# Patient Record
Sex: Female | Born: 1935 | Race: White | Hispanic: No | Marital: Married
Health system: Southern US, Community
[De-identification: ages and names within clinical notes are randomized; demographics above are authoritative.]

---

## 2004-07-17 ENCOUNTER — Inpatient Hospital Stay: Payer: Self-pay | Admitting: Internal Medicine

## 2004-10-09 ENCOUNTER — Ambulatory Visit: Payer: Self-pay | Admitting: Family Medicine

## 2005-04-09 ENCOUNTER — Other Ambulatory Visit: Payer: Self-pay

## 2005-04-10 ENCOUNTER — Inpatient Hospital Stay: Payer: Self-pay | Admitting: Internal Medicine

## 2005-06-29 ENCOUNTER — Emergency Department: Payer: Self-pay | Admitting: Emergency Medicine

## 2006-10-06 ENCOUNTER — Other Ambulatory Visit: Payer: Self-pay

## 2006-10-06 ENCOUNTER — Inpatient Hospital Stay: Payer: Self-pay | Admitting: Internal Medicine

## 2006-11-08 ENCOUNTER — Other Ambulatory Visit: Payer: Self-pay

## 2006-11-08 ENCOUNTER — Emergency Department: Payer: Self-pay | Admitting: Internal Medicine

## 2006-11-23 ENCOUNTER — Other Ambulatory Visit: Payer: Self-pay

## 2006-11-23 ENCOUNTER — Emergency Department: Payer: Self-pay | Admitting: Emergency Medicine

## 2006-12-01 ENCOUNTER — Inpatient Hospital Stay: Payer: Self-pay | Admitting: Rheumatology

## 2006-12-25 ENCOUNTER — Inpatient Hospital Stay: Payer: Self-pay | Admitting: Specialist

## 2007-01-25 ENCOUNTER — Inpatient Hospital Stay: Payer: Self-pay | Admitting: Specialist

## 2007-11-09 IMAGING — CR DG ABDOMEN 1V
1 series · 1 of 1 positions shown · non-contrast
Comparison: none

REASON FOR EXAM: diarrhea
COMMENTS:

PROCEDURE:     DXR - DXR KIDNEY URETER BLADDER  - November 08, 2006 [DATE]
RESULT:     The bowel gas pattern is relatively nonspecific. The bones are
osteopenic. No abnormal calcifications are seen. Mild narrowing of the left
hip joint is noted.

[view not recorded]
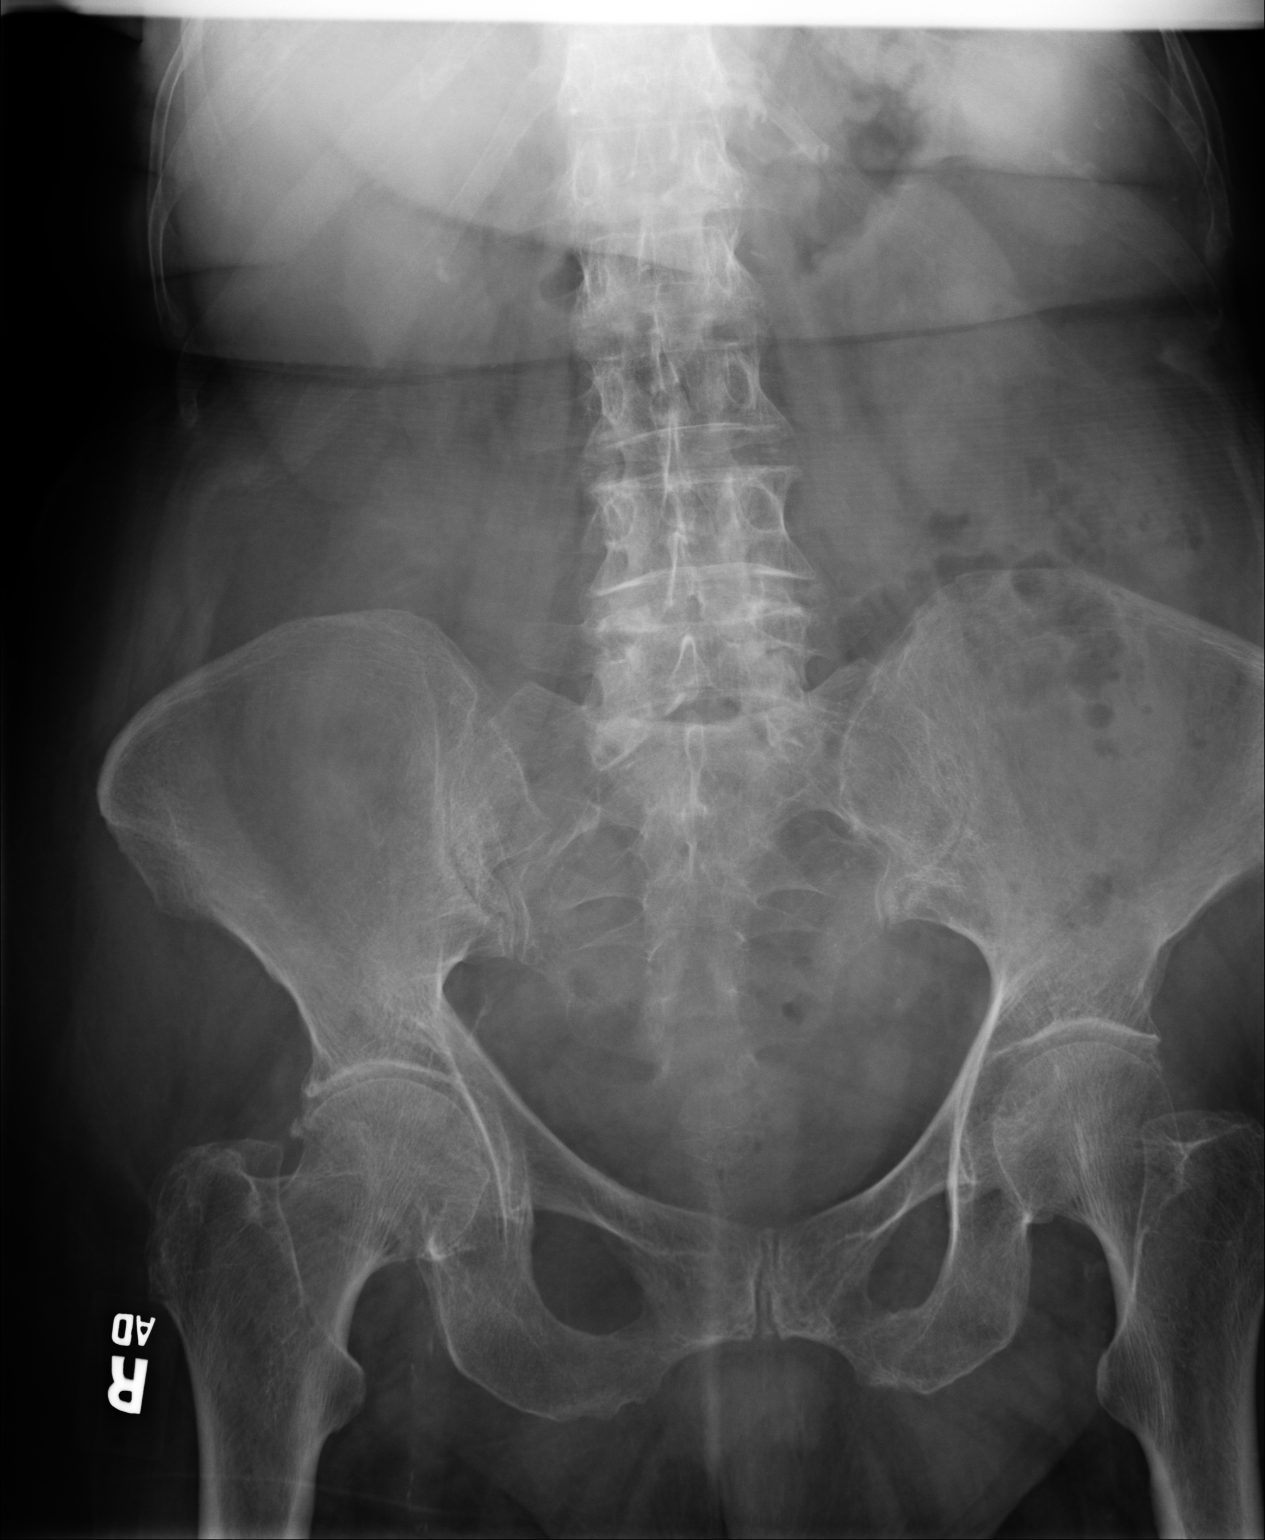

[1 of 1 positions shown; findings below may reference images not displayed]

IMPRESSION: 1. I do not see evidence of bowel obstruction or ileus.
2. There is diffuse osteopenia of the bony structures and mild degenerative
change.

## 2007-12-27 IMAGING — CR DG ABDOMEN 3V
1 series · 3 of 3 positions shown · non-contrast
Comparison: none

REASON FOR EXAM: diarrhea
COMMENTS:

[Series 1: view not recorded · 0.17mm/px · 3 of 3 slices shown]
[im 1/3]
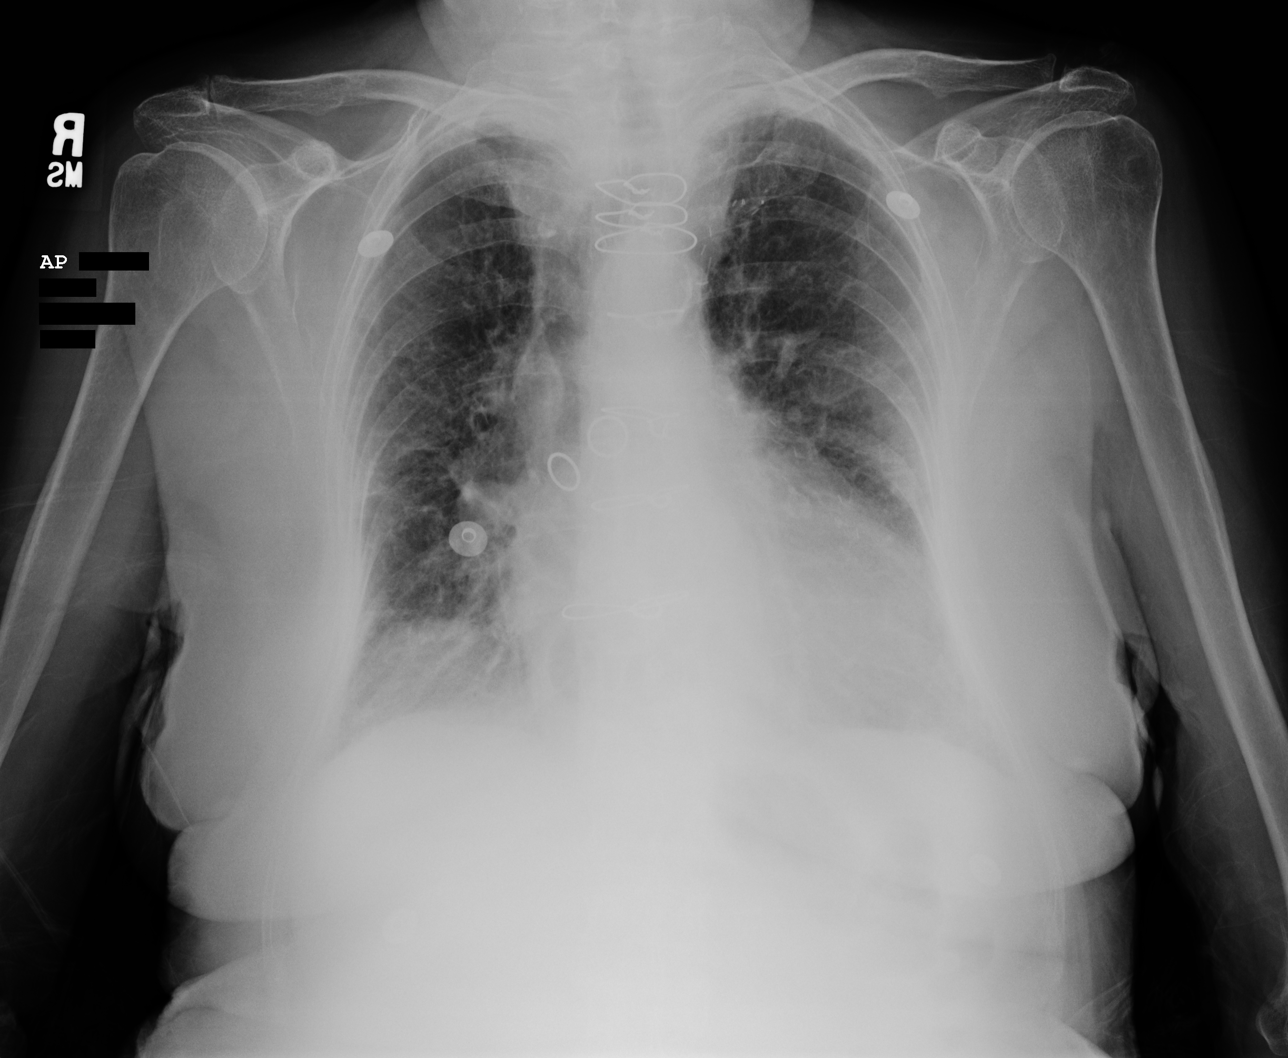
[im 2/3]
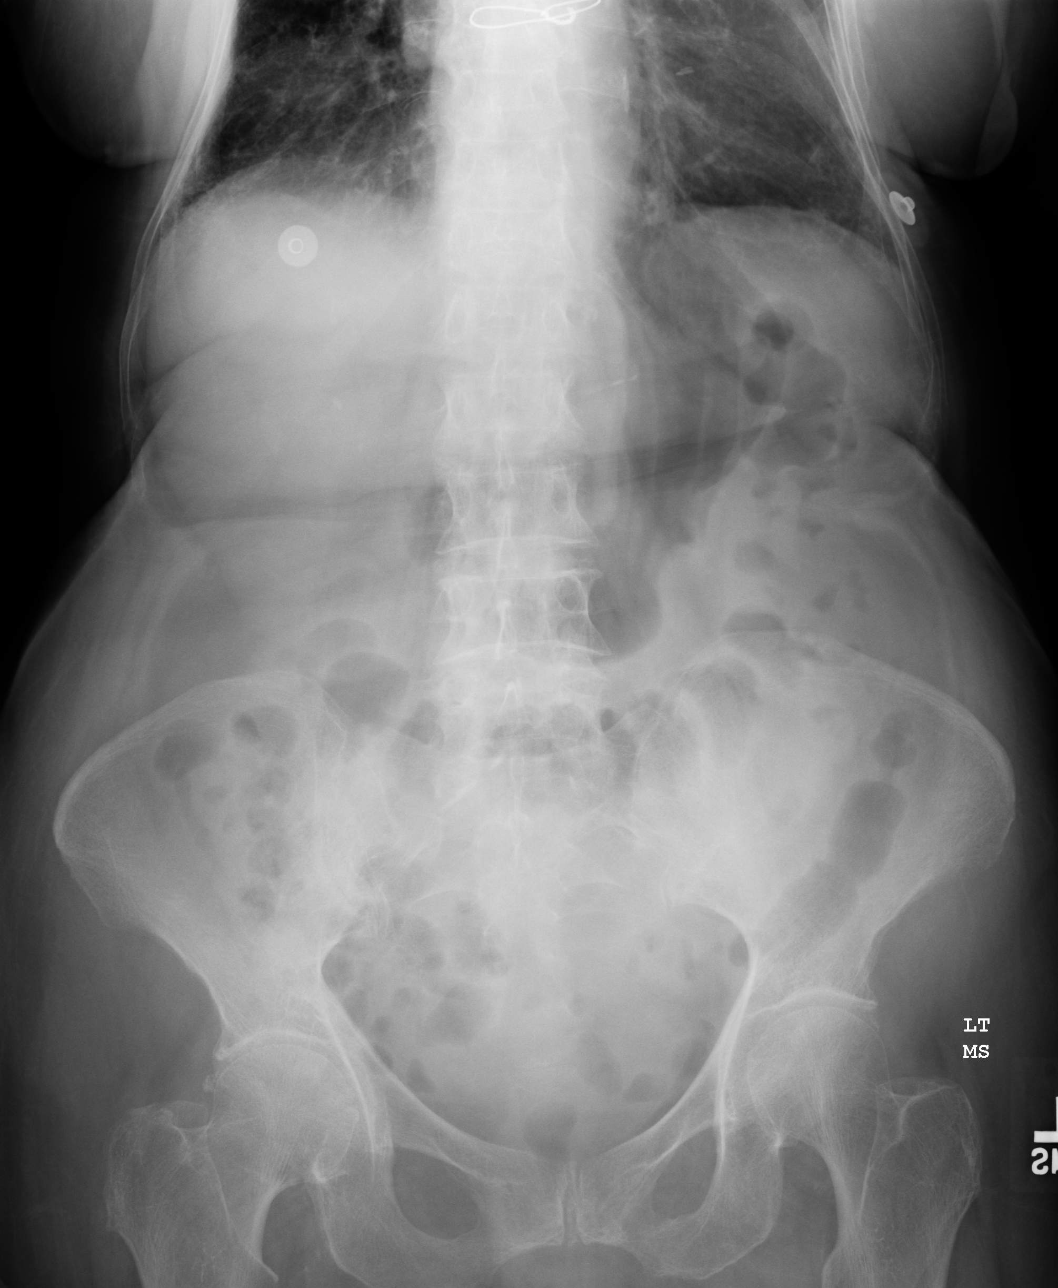
[im 3/3]
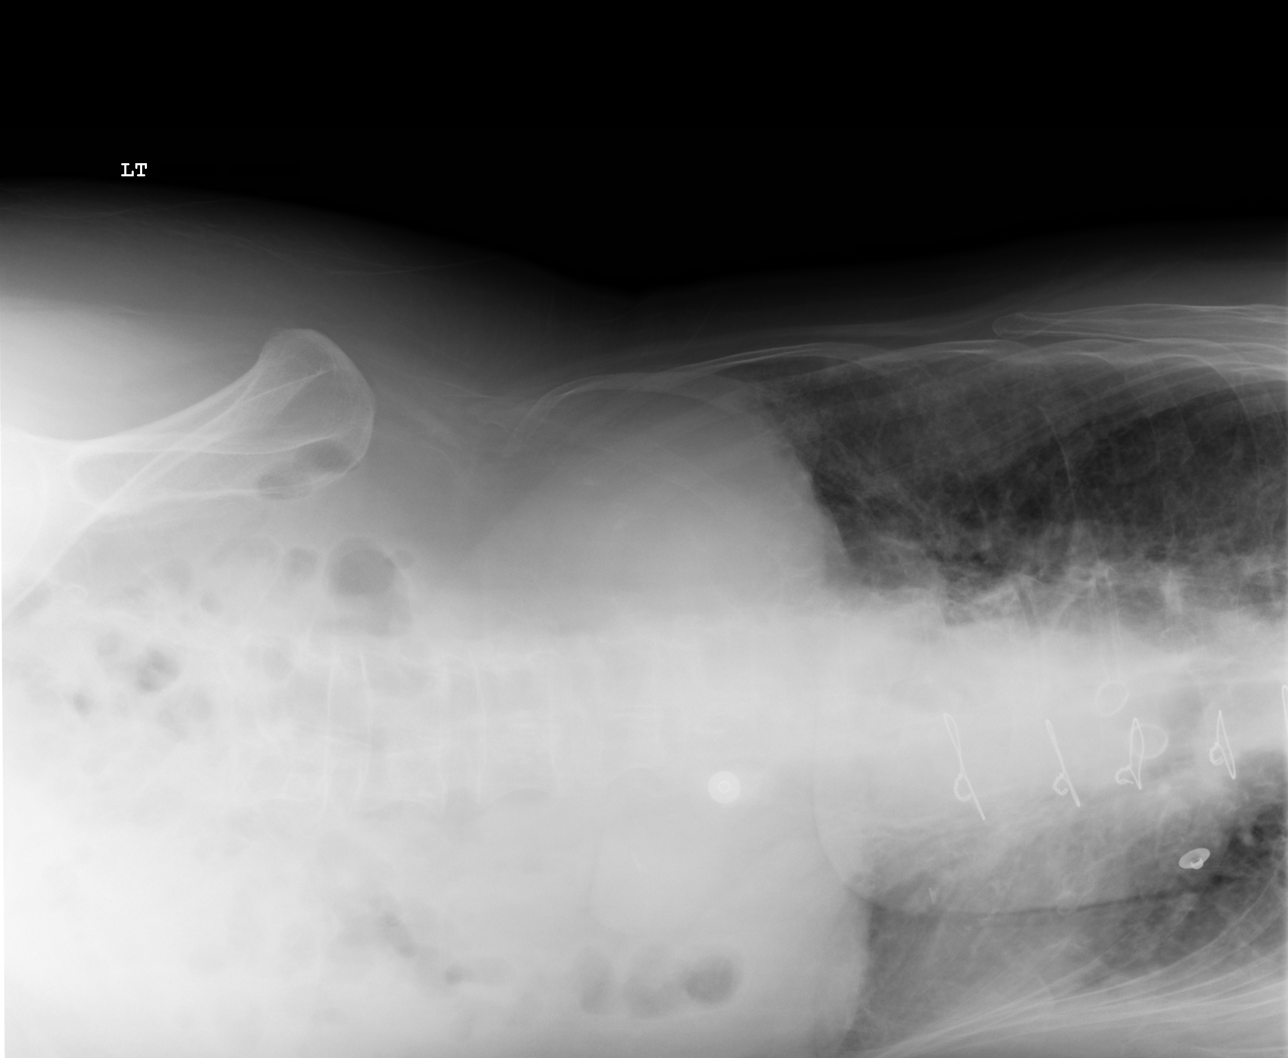

[3 of 3 positions shown; findings below may reference images not displayed]

PROCEDURE:     DXR - DXR ABDOMEN 3-WAY (INCL PA CXR)  - December 26, 2006 [DATE]

RESULT:     The chest film reveals the lungs to be mildly hyperinflated. The
interstitial markings are increased but not greatly changed from [DATE].
The cardiac silhouette remains mildly enlarged but is also not greatly
changed considering the AP portable technique. The bowel gas pattern is
relatively nonspecific. There is no evidence of obstruction or ileus. There
is some gas in the rectum. The bony structures are osteopenic.
IMPRESSION: 1. There are findings consistent with COPD. Mildly increased interstitial
markings may reflect low-grade CHF but they are not greatly changed from [DATE]. I do not see acute intraabdominal abnormality. Specifically, the bowel
gas pattern is felt to be within the limits of normal.

## 2008-01-30 IMAGING — CR DG CHEST 2V
1 series · 2 of 2 positions shown · non-contrast
Comparison: none

REASON FOR EXAM: pleural Ficus Lidia on right
COMMENTS:  *** There is an active Isolation Contact on this patient. **

PROCEDURE:     DXR - DXR CHEST PA (OR AP) AND LATERAL  - January 29, 2007  [DATE]
RESULT:     Comparison: 01/25/2007.

[Series 1: view not recorded · 0.17mm/px · 2 of 2 slices shown]
[im 1/2]
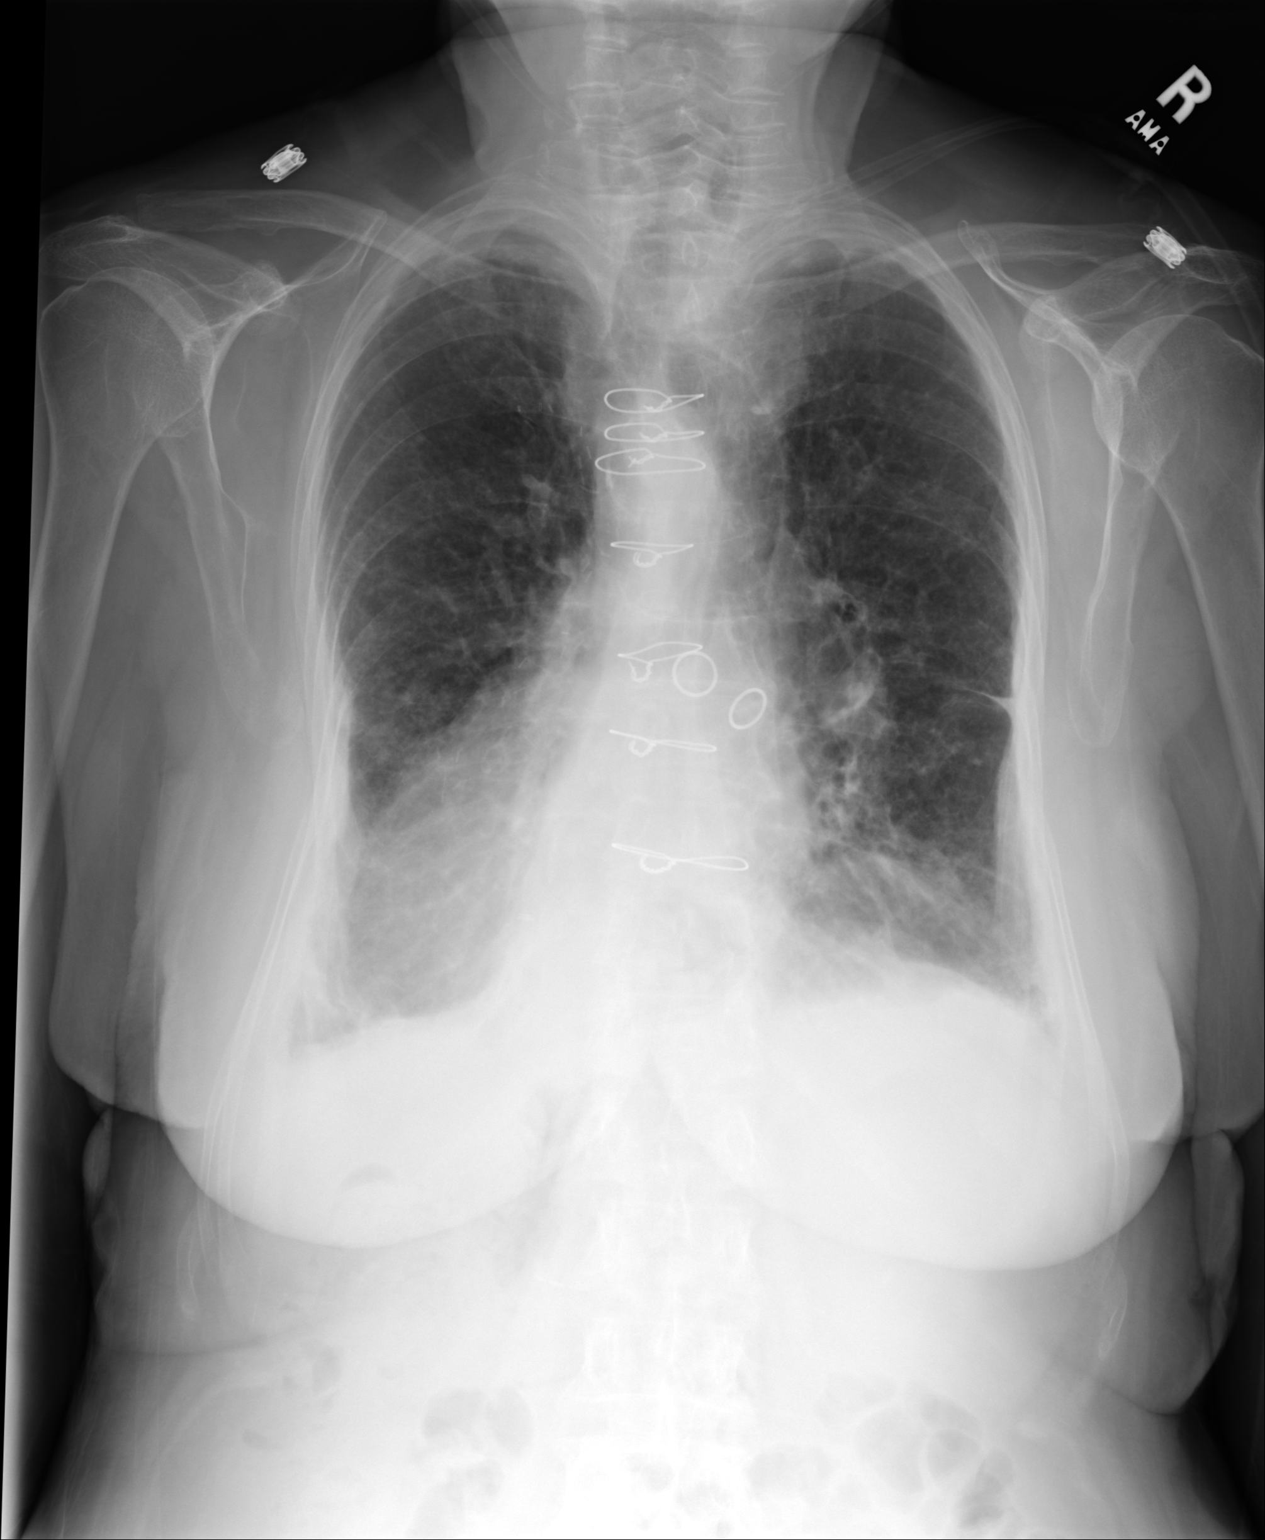
[im 2/2]
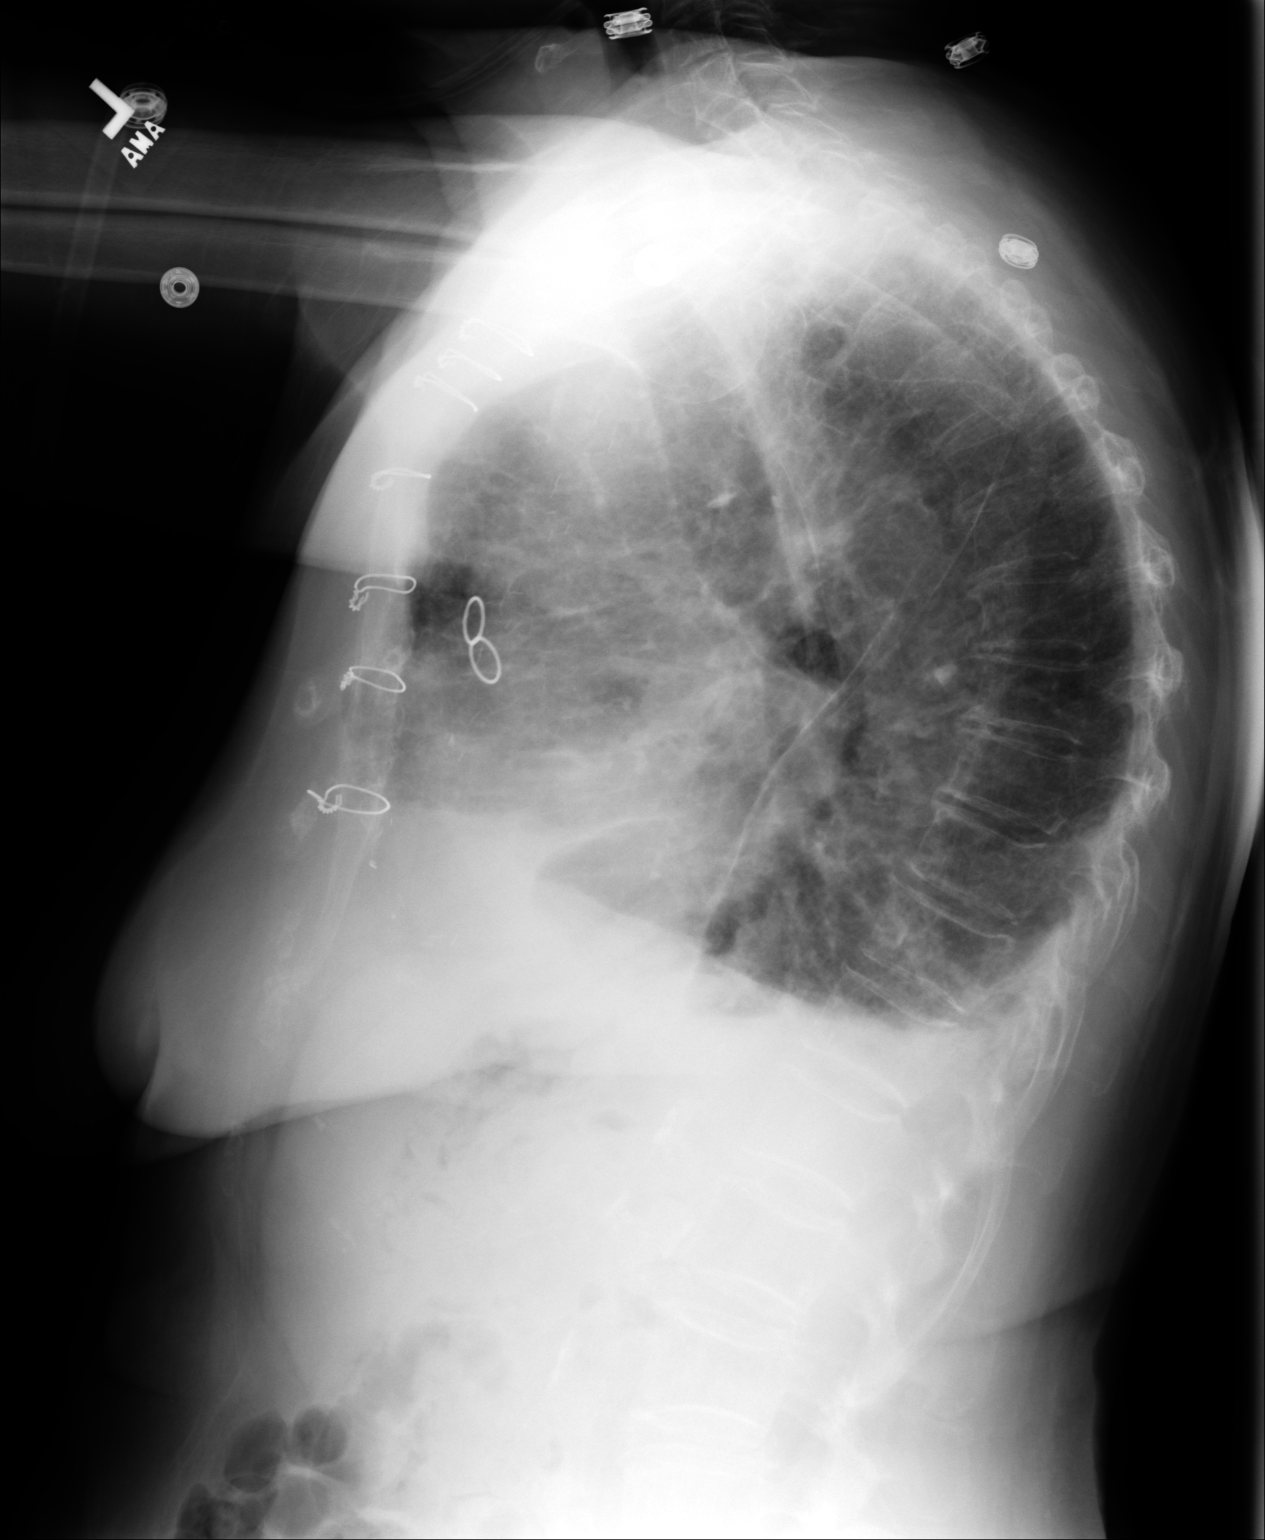

[2 of 2 positions shown; findings below may reference images not displayed]

FINDINGS: Changes of coronary artery bypass graft procedure are again noted.
Redemonstration of small bilateral pleural effusions. Redemonstration of
emphysematous changes of the lungs. Mild right basilar atelectasis. No
pneumothorax. Stable cardiomediastinal silhouette.
IMPRESSION: 1. Please see above.

## 2008-08-21 ENCOUNTER — Inpatient Hospital Stay: Payer: Self-pay | Admitting: Internal Medicine

## 2008-08-26 ENCOUNTER — Encounter: Payer: Self-pay | Admitting: Internal Medicine

## 2008-09-03 ENCOUNTER — Encounter: Payer: Self-pay | Admitting: Internal Medicine

## 2008-12-05 ENCOUNTER — Inpatient Hospital Stay: Payer: Self-pay | Admitting: Internal Medicine

## 2008-12-09 ENCOUNTER — Encounter: Payer: Self-pay | Admitting: Internal Medicine

## 2009-10-15 ENCOUNTER — Emergency Department: Payer: Self-pay | Admitting: Emergency Medicine

## 2011-06-16 ENCOUNTER — Inpatient Hospital Stay: Payer: Self-pay | Admitting: Internal Medicine

## 2011-06-16 LAB — COMPREHENSIVE METABOLIC PANEL
Albumin: 3.7 g/dL (ref 3.4–5.0)
Alkaline Phosphatase: 62 U/L (ref 50–136)
Bilirubin,Total: 0.6 mg/dL (ref 0.2–1.0)
Calcium, Total: 9 mg/dL (ref 8.5–10.1)
Chloride: 105 mmol/L (ref 98–107)
Co2: 27 mmol/L (ref 21–32)
Creatinine: 1.88 mg/dL — ABNORMAL HIGH (ref 0.60–1.30)
EGFR (African American): 32 — ABNORMAL LOW
EGFR (Non-African Amer.): 26 — ABNORMAL LOW
SGPT (ALT): 10 U/L — ABNORMAL LOW

## 2011-06-16 LAB — CBC
HCT: 36.8 % — ABNORMAL LOW (ref 40.0–52.0)
HGB: 11.7 g/dL — ABNORMAL LOW (ref 12.0–16.0)
MCV: 89 fL (ref 80–100)
RBC: 4.14 10*6/uL — ABNORMAL LOW (ref 4.40–5.90)
RDW: 15.4 % — ABNORMAL HIGH (ref 11.5–14.5)

## 2011-06-16 LAB — TROPONIN I: Troponin-I: <0.02 ng/mL

## 2011-06-16 LAB — CK TOTAL AND CKMB (NOT AT ARMC): CK, Total: 65 U/L (ref 21–232)

## 2011-06-16 LAB — PROTIME-INR
INR: 1.1
Prothrombin Time: 14.4 secs (ref 11.5–14.7)

## 2011-06-16 LAB — RAPID INFLUENZA A&B ANTIGENS

## 2011-06-17 LAB — CBC WITH DIFFERENTIAL/PLATELET
Basophil #: 0 10*3/uL (ref 0.0–0.1)
Basophil %: 0.3 %
Eosinophil #: 0 10*3/uL (ref 0.0–0.7)
Eosinophil %: 0 %
HCT: 35.9 % — ABNORMAL LOW (ref 40.0–52.0)
HGB: 11.6 g/dL — ABNORMAL LOW (ref 12.0–16.0)
Lymphocyte #: 0.7 10*3/uL — ABNORMAL LOW (ref 1.0–3.6)
Lymphocyte %: 12 %
MCH: 28.3 pg (ref 26.0–34.0)
MCHC: 32.3 g/dL (ref 32.0–36.0)
Monocyte %: 1.2 %
Neutrophil #: 5.1 10*3/uL (ref 1.4–6.5)
Neutrophil %: 86.5 %
Platelet: 198 10*3/uL (ref 150–440)
RBC: 4.09 10*6/uL — ABNORMAL LOW (ref 4.40–5.90)
RDW: 15.4 % — ABNORMAL HIGH (ref 11.5–14.5)

## 2011-06-17 LAB — BASIC METABOLIC PANEL
Anion Gap: 9 (ref 7–16)
BUN: 43 mg/dL — ABNORMAL HIGH (ref 7–18)
Co2: 26 mmol/L (ref 21–32)
Creatinine: 2.05 mg/dL — ABNORMAL HIGH (ref 0.60–1.30)
EGFR (African American): 29 — ABNORMAL LOW
EGFR (Non-African Amer.): 24 — ABNORMAL LOW
Glucose: 148 mg/dL — ABNORMAL HIGH (ref 65–99)
Potassium: 4.4 mmol/L (ref 3.5–5.1)
Sodium: 140 mmol/L (ref 136–145)

## 2011-06-18 LAB — BASIC METABOLIC PANEL
Calcium, Total: 8.7 mg/dL (ref 8.5–10.1)
Chloride: 104 mmol/L (ref 98–107)
Creatinine: 2.01 mg/dL — ABNORMAL HIGH (ref 0.60–1.30)
Glucose: 135 mg/dL — ABNORMAL HIGH (ref 65–99)
Potassium: 4.5 mmol/L (ref 3.5–5.1)
Sodium: 142 mmol/L (ref 136–145)

## 2011-06-19 LAB — BASIC METABOLIC PANEL
Calcium, Total: 8.8 mg/dL (ref 8.5–10.1)
Chloride: 108 mmol/L — ABNORMAL HIGH (ref 98–107)
Co2: 26 mmol/L (ref 21–32)
EGFR (African American): 32 — ABNORMAL LOW
Potassium: 4.2 mmol/L (ref 3.5–5.1)
Sodium: 145 mmol/L (ref 136–145)

## 2011-06-20 LAB — BASIC METABOLIC PANEL
Anion Gap: 11 (ref 7–16)
BUN: 67 mg/dL — ABNORMAL HIGH (ref 7–18)
Chloride: 106 mmol/L (ref 98–107)
Creatinine: 1.6 mg/dL — ABNORMAL HIGH (ref 0.60–1.30)
EGFR (African American): 38 — ABNORMAL LOW
EGFR (Non-African Amer.): 32 — ABNORMAL LOW
Glucose: 106 mg/dL — ABNORMAL HIGH (ref 65–99)
Osmolality: 303 (ref 275–301)

## 2011-06-20 LAB — CBC WITH DIFFERENTIAL/PLATELET
Bands: 4 %
MCH: 27.8 pg (ref 26.0–34.0)
MCHC: 31.7 g/dL — ABNORMAL LOW (ref 32.0–36.0)
RDW: 15.3 % — ABNORMAL HIGH (ref 11.5–14.5)
Segmented Neutrophils: 76 %

## 2011-06-21 ENCOUNTER — Encounter: Payer: Self-pay | Admitting: Internal Medicine

## 2011-06-22 LAB — CULTURE, BLOOD (SINGLE)

## 2011-07-05 ENCOUNTER — Encounter: Payer: Self-pay | Admitting: Internal Medicine

## 2011-08-05 ENCOUNTER — Encounter: Payer: Self-pay | Admitting: Internal Medicine

## 2011-09-04 ENCOUNTER — Encounter: Payer: Self-pay | Admitting: Internal Medicine

## 2012-03-26 ENCOUNTER — Other Ambulatory Visit: Payer: Self-pay

## 2012-05-06 DEATH — deceased

## 2014-08-28 NOTE — H&P (Signed)
PATIENT NAME:  Brittany Stein, Brittany Stein MR#:  [id number] DATE OF BIRTH:  [birth year]  DATE OF ADMISSION:  06/16/2011  CHIEF COMPLAINT: Shortness of breath.   HISTORY OF PRESENT ILLNESS: Brittany Stein is a 81 year old woman with history of chronic obstructive pulmonary disease and coronary artery disease who lives by herself. She is brought in by her daughter-in-law who is her closest living relative. Her daughter-in-law states that the patient has been getting progressively dyspneic over the past several months. She has oxygen at home, which she refuses to wear. Over the past couple of days her daughter-in-law  has noticed an exacerbation of her congestion and coughing as well as decreased p.o. intake and some intermittent confusion. She does also report that her mother-in-law has some lower extremity edema off and on, but this has not been a problem over the past 48 hours.   REVIEW OF SYSTEMS: Unobtainable secondary to confusion.   PAST MEDICAL HISTORY:  1. Hypertension.  2. Gastroesophageal reflux disease.  3. Hypothyroidism.  4. Chronic obstructive pulmonary disease.  5. Coronary artery disease, status post bypass.   PRIMARY CARE PHYSICIAN: Dr. Graciela HusbandsKlein   FAMILY HISTORY: Unobtainable secondary to altered mental status.   SOCIAL HISTORY: She lives alone. Her daughter-in-law manages all of her medications and is her power of attorney. She is widowed. No tobacco, but she does have a history of using dip. No alcohol. She used to work at  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 She is DNR/DNI. POA and contact person is her daughter-in-law, Wardell HonourJanie Colgan, who can be reached at [phone].   ALLERGIES: Penicillin, simvastatin, Tussionex.   HOME MEDICATIONS:  1. Amlodipine 5 mg p.o. daily.  2. Aspirin 81 mg p.o. daily.  3. Synthroid 75 mcg p.o. daily.  4. Metoprolol 25 mg daily. 5. Spiriva. 6. Advair. 7. ProAir. 8. Prevacid 30 mg p.o.  every other day.   PHYSICAL EXAMINATION:  VITAL SIGNS: On admission, temperature 98.7, heart rate 74, respiratory rate 27, blood pressure 119/51, oxygen saturation 87% on 2 liters nasal cannula.   GENERAL: She is in no acute distress. She is well developed and well nourished.   HEENT: Conjunctivae and lids are without erythema or pallor. Pupils are equally round and reactive to light and accommodation. Ears and nose are without lesions or masses. Hearing is intact, though decreased bilaterally. Nasal mucosa, septum, and turbinates are without lesions or masses. Lips, teeth, and gums are without lesions or masses.  Oropharynx is clear.  Oral mucosa is moist. Posterior pharynx is free of exudates.   NECK: Supple. There are no masses. Thyroid is not enlarged. No nodules.   LUNGS: She does appear to be in respiratory distress and is using accessory muscles. She also has some intercostal retractions and wheezing audible without a stethoscope. She has wheezing throughout all lung fields.   HEART: Heart sounds are distant, but regular. No murmurs are appreciated. Pedal pulses are 2+ and equal bilaterally. Negative for edema.   ABDOMEN: Soft, nontender, nondistended. Liver and spleen are not enlarged. Bowel sounds are normoactive.   LYMPH: Neck and axilla are free of lymphadenopathy.   MUSCULOSKELETAL: She has no clubbing, cyanosis, or edema. She has full range of motion with good strength and tone in all four extremities.   SKIN: Skin and subcutaneous tissues are without rashes or lesions. Palpation of the skin reveals no induration or nodules.   NEUROLOGIC: Cranial nerves II through XII are grossly intact.  Deep tendon reflexes are 2+ and equal bilaterally. Judgment and insight are poor. The patient is oriented to person and place, but not to time. Recent and remote memory is intact.   LABORATORY DATA: White blood cell count 8.8, hematocrit 36.8, platelets 210. Sodium 144, potassium 5.2, chloride  105, bicarbonate 27, BUN 36, creatinine 1.88, glucose 105. CK 65. MB 0.7. Troponin is less than 0.02. Pro BNP 3451. INR 1.1. Chest x-ray is negative. Influenza negative, pH on VBG is normal.    ASSESSMENT AND PLAN: 81 year old woman with history of chronic obstructive pulmonary disease who presents with progressive shortness of breath and hypoxia.  1. Hypoxia: Chest x-ray shows no pneumonia, most likely she has bronchitis. Influenza is negative. She is a DO NOT RESUSCITATE so she is being maintained on a Ventimask at this point. Oxygen saturation goal is greater than 90%. We will not pursue PE work-up given that her daughter would like to minimize testing and goals of care are comfort. We will plan to diurese with IV Lasix given her history of coronary artery disease and elevated BNP. She is also getting nebulizers, steroids, and antibiotics for possible chronic obstructive pulmonary disease exacerbation.  2. Hypertension: Her blood pressure is good. We will continue her home medications.  3. Confusion: Her electrolytes are good. We will plan to minimize deleriogenic medications.  4. Chronic renal insufficiency: Renally dose medications. Last known baseline creatinine was 1.77 so she is not far off from her baseline.     TOTAL TIME SPENT ON CARE AND COORDINATION: 50 minutes.   ____________________________ Lise Auer Manson Passey, MD [id number] D: 06/16/2011 20:59:38 ET T: 06/17/2011 07:45:29 ET JOB#: [id number]  cc: Victorino Dike L. Manson Passey, MD, <Dictator> Curtis Sites III, MD Eber Hong MD ELECTRONICALLY SIGNED 06/18/2011 18:24

## 2014-08-28 NOTE — Discharge Summary (Signed)
PATIENT NAME:  Brittany Stein, Brittany Stein MR#:  [id number] DATE OF BIRTH:  [birth year]  DATE OF ADMISSION:  06/16/2011 DATE OF DISCHARGE:  06/20/2011  FINAL DIAGNOSES:  1. Chronic obstructive pulmonary disease with acute exacerbation.  2. Acute on chronic respiratory failure.  3. Hypertension.  4. Gastroesophageal reflux disease.  5. Hypothyroidism.  6. Coronary artery disease with prior coronary artery bypass surgery.  7. Pulmonary edema secondary to chronic obstructive pulmonary disease exacerbation.   HISTORY AND PHYSICAL: Please see dictated admission history and physical.   HOSPITAL COURSE: Patient was admitted with acute worsening respiratory status. She is chronically on 2 liters nasal cannula that required increased oxygen. She was found to have significant wheezing as well as was found to have evidence of pulmonary edema on her films. Echocardiogram was performed, which revealed preserved LV function, really no significant evidence of right heart failure either, and it was thought that pulmonary edema was secondary to hypoxia and her acute on chronic respiratory failure. She did show some evidence of acute renal failure as she was diuresed, and actually she was put back on low rate fluid, with some improvement, though this will need to be followed. She was able to be weaned down to her home rate of 2 liters nasal cannula. Physical therapy worked with the patient, and she did show progress, however, she still lacked stamina and balance and was felt that she would improve more with skilled nursing and rehabilitation. This was discussed with the patient and family members, and they were in agreement, at this time she will be discharged to nursing home in stable condition with physical activity up with a walker with assistance. She should be on fall precautions and oxygen should be applied 2 liters nasal cannula continuously. Her diet should be no added salt. She will need a MET-B and CBC in one week with  results to the nursing home physician. Physical therapy and occupational therapy should evaluate and treat the patient.   DISCHARGE MEDICATIONS:  1. Amlodipine 5 mg p.o. daily. 2. Enteric-coated aspirin 81 mg p.o. daily.  3. Levothyroxine 0.075 mg p.o. daily.  4. Toprol-XL 25 mg p.o. daily.  5. Advair 100/50, 1 puff b.i.d.  6. Prevacid 30 mg p.o. daily for reflux and for gastric protection.  7. Spiriva 1 capsule inhaled daily.  8. Albuterol metered-dose inhaler 2 puffs t.i.d., and q.4 hours p.r.n. wheeze.  9. Prednisone taper starting 40 mg p.o. daily, decreasing x10 mg every two days.  10. Zithromax 250 mg p.o. daily x1 day to complete a course.   CODE STATUS: During this patient's hospitalization she was DO NOT RESUSCITATE. An out of facility DO NOT RESUSCITATE form was sent with her to the nursing facility.   TIME SPENT ON DISCHARGE: 45 minutes.  ____________________________ Adin Hector, MD bjk:cms D: 06/20/2011 08:16:35 ET T: 06/20/2011 08:47:26 ET JOB#: [id number]  cc: Adin Hector, MD, <Dictator> Ramonita Lab MD ELECTRONICALLY SIGNED 06/24/2011 13:37
